# Patient Record
Sex: Female | Born: 1935 | Race: White | Hispanic: No | Smoking: Never smoker
Health system: Southern US, Community
[De-identification: ages and names within clinical notes are randomized; demographics above are authoritative.]

## PROBLEM LIST (undated history)

## (undated) DIAGNOSIS — N200 Calculus of kidney: Secondary | ICD-10-CM

## (undated) DIAGNOSIS — E039 Hypothyroidism, unspecified: Secondary | ICD-10-CM

## (undated) HISTORY — DX: Calculus of kidney: N20.0

## (undated) HISTORY — PX: HYSTERECTOMY ABDOMINAL WITH SALPINGO-OOPHORECTOMY: SHX6792

## (undated) HISTORY — DX: Hypothyroidism, unspecified: E03.9

## (undated) HISTORY — PX: APPENDECTOMY: SHX54

---

## 1970-12-26 HISTORY — PX: BREAST EXCISIONAL BIOPSY: SUR124

## 2002-11-15 ENCOUNTER — Encounter: Admission: RE | Admit: 2002-11-15 | Discharge: 2002-11-15 | Payer: Self-pay | Admitting: Internal Medicine

## 2002-11-15 ENCOUNTER — Encounter: Payer: Self-pay | Admitting: Internal Medicine

## 2004-09-23 ENCOUNTER — Ambulatory Visit (HOSPITAL_COMMUNITY): Admission: RE | Admit: 2004-09-23 | Discharge: 2004-09-23 | Payer: Self-pay | Admitting: Internal Medicine

## 2006-01-20 ENCOUNTER — Ambulatory Visit (HOSPITAL_COMMUNITY): Admission: RE | Admit: 2006-01-20 | Discharge: 2006-01-20 | Payer: Self-pay | Admitting: Internal Medicine

## 2006-04-30 ENCOUNTER — Encounter: Admission: RE | Admit: 2006-04-30 | Discharge: 2006-04-30 | Payer: Self-pay | Admitting: Orthopedic Surgery

## 2006-10-23 ENCOUNTER — Emergency Department (HOSPITAL_COMMUNITY): Admission: EM | Admit: 2006-10-23 | Discharge: 2006-10-24 | Payer: Self-pay | Admitting: Emergency Medicine

## 2007-02-07 ENCOUNTER — Ambulatory Visit (HOSPITAL_COMMUNITY): Admission: RE | Admit: 2007-02-07 | Discharge: 2007-02-07 | Payer: Self-pay | Admitting: Internal Medicine

## 2007-05-29 ENCOUNTER — Ambulatory Visit (HOSPITAL_COMMUNITY): Admission: RE | Admit: 2007-05-29 | Discharge: 2007-05-30 | Payer: Self-pay | Admitting: Obstetrics and Gynecology

## 2007-05-29 ENCOUNTER — Encounter: Payer: Self-pay | Admitting: Obstetrics and Gynecology

## 2008-02-13 ENCOUNTER — Ambulatory Visit (HOSPITAL_COMMUNITY): Admission: RE | Admit: 2008-02-13 | Discharge: 2008-02-13 | Payer: Self-pay | Admitting: Internal Medicine

## 2008-04-21 ENCOUNTER — Ambulatory Visit: Payer: Self-pay | Admitting: Internal Medicine

## 2008-04-28 ENCOUNTER — Ambulatory Visit: Payer: Self-pay | Admitting: Internal Medicine

## 2008-04-29 ENCOUNTER — Telehealth: Payer: Self-pay | Admitting: Internal Medicine

## 2008-05-04 ENCOUNTER — Emergency Department (HOSPITAL_COMMUNITY): Admission: EM | Admit: 2008-05-04 | Discharge: 2008-05-04 | Payer: Self-pay | Admitting: Emergency Medicine

## 2009-02-23 ENCOUNTER — Ambulatory Visit (HOSPITAL_COMMUNITY): Admission: RE | Admit: 2009-02-23 | Discharge: 2009-02-23 | Payer: Self-pay | Admitting: Internal Medicine

## 2010-04-20 ENCOUNTER — Ambulatory Visit (HOSPITAL_COMMUNITY): Admission: RE | Admit: 2010-04-20 | Discharge: 2010-04-20 | Payer: Self-pay | Admitting: Internal Medicine

## 2011-01-16 ENCOUNTER — Encounter: Payer: Self-pay | Admitting: Internal Medicine

## 2011-03-14 ENCOUNTER — Other Ambulatory Visit (HOSPITAL_COMMUNITY): Payer: Self-pay | Admitting: Internal Medicine

## 2011-03-14 DIAGNOSIS — Z1231 Encounter for screening mammogram for malignant neoplasm of breast: Secondary | ICD-10-CM

## 2011-04-22 ENCOUNTER — Ambulatory Visit (HOSPITAL_COMMUNITY): Payer: Self-pay

## 2011-04-27 ENCOUNTER — Ambulatory Visit (HOSPITAL_COMMUNITY)
Admission: RE | Admit: 2011-04-27 | Discharge: 2011-04-27 | Disposition: A | Discharge status: Home / Self Care | Payer: Medicare Other | Source: Ambulatory Visit | Attending: Internal Medicine | Admitting: Internal Medicine

## 2011-04-27 DIAGNOSIS — Z1231 Encounter for screening mammogram for malignant neoplasm of breast: Secondary | ICD-10-CM

## 2011-05-13 NOTE — Discharge Summary (Signed)
NAME:  Paula Liu, Paula Liu               ACCOUNT NO.:  [id number]   MEDICAL RECORD NO.:  [id number]          PATIENT TYPE:  OIB   LOCATION:  9307                          FACILITY:  [facility]   PHYSICIAN:  Cynthia P. Romine, M.D.DATE OF BIRTH:  [birth year]   DATE OF ADMISSION:  05/29/2007  DATE OF DISCHARGE:  05/30/2007                               DISCHARGE SUMMARY   DISCHARGE DIAGNOSIS:  Symptomatic cystocele and left lower quadrant pain  with left ovarian cyst.   PROCEDURE:  Laparoscopic bilateral salpingo-oophorectomy by Dr. Aram Beecham  Romine and cystoscopy with anterior repair with mesh by Dr. Eudelia Bunch.   HISTORY:  This is a 77 year old white female who was admitted with left  lower quadrant pain and a large symptomatic cystocele and a known left  ovarian cyst on ultrasound.  She was admitted on May 29, 2007 and  underwent a laparoscopic bilateral salpingo-oophorectomy by Dr. Arline Asp  Romine followed by a cystoscopy with anterior repair with mesh by Dr.  Eudelia Bunch.   Postoperatively she did very well.  She had no complications, and she  was in satisfactory condition for discharge on her first postoperative  day.  She was given full discharge instructions regarding followup in  the office with Dr. Tresa Res and with Dr. Logan Bores.   LABORATORY DATA:  Admission H&H was 14 and 42, on discharge 9 and 27.   Pathology showed a benign serous cyst on the left and a small benign  Brenner tumor.  The right tube and ovary were unremarkable, except for a  small paratubal cyst.      Cynthia P. Romine, M.D.  Electronically Signed     CPR/MEDQ  D:  07/30/2007  T:  07/30/2007  Job:  [id number]

## 2011-05-13 NOTE — Op Note (Signed)
NAME:  Paula Liu, Paula Liu               ACCOUNT NO.:  [id number]   MEDICAL RECORD NO.:  [id number]          PATIENT TYPE:  AMB   LOCATION:  SDC                           FACILITY:  [facility]   PHYSICIAN:  Cynthia P. Romine, M.D.DATE OF BIRTH:  [birth year]   DATE OF PROCEDURE:  05/29/2007  DATE OF DISCHARGE:                               OPERATIVE REPORT   PREOPERATIVE DIAGNOSIS:  Left lower quadrant pain, left ovarian cyst and  large symptomatic cystocele.   POSTOPERATIVE DIAGNOSES:  Left lower quadrant pain, left ovarian cyst  and large symptomatic cystocele, path pending.   PROCEDURE:  Laparoscopic bilateral salpingo-oophorectomy, cystocele  repair by Dr. Logan Bores which will be dictated separately.   SURGEON:  Cynthia P. Romine, M.D.   ASSISTANT:  Lum Keas, MD   ANESTHESIA:  General endotracheal.   ESTIMATED BLOOD LOSS:  Minimal.   COMPLICATIONS:  None.   PROCEDURE:  The patient was taken to the operating room and after  induction of adequate general anesthesia was placed in dorsal lithotomy  position and prepped and draped in usual fashion.  A Foley catheter was  inserted.  A subumbilical incision was made with the knife and the  Veress needle was inserted into peritoneal space.  Proper placement was  tested by noting a negative aspirate, then free flow of saline through  the Veress needle again with negative aspirate and then by noting the  response of a drop of saline placed at the hub of the Veress needle to  negative pressure as the abdominal wall was elevated.  Pneumoperitoneum  was treated with two liters of CO2 using the Storz automatic  insufflator.  A 10-mm trocar was then inserted into the peritoneal space  and its proper placement noted with a laparoscope.  Next two 5 mm  trocars were inserted under direct visualization on the right and left  side of the lower quadrants.  The pelvis was inspected.  The patient was  status post a hysterectomy.  The right tube and  ovary appeared normal.  The left ovary had a 5 cm clear looking cyst.  The remainder of the  pelvis looked normal.  Peritoneal washings were taken by putting 50 mL  of saline through the left lower wall trocar and then aspirating it.  These were sent for cytology.  The ureters were identified bilaterally.  Two Endoloops were placed around the left ovarian pedicle and the ovary  was cut free.  Similarly two 0 Vicryl Endoloops were used to go around  the right ovarian pedicle and it was cut free.  The right ovary and tube  was pulled out through the umbilical port after having inserted a 5 mm  scope to the right lower quadrant trocar sleeve.  An Endopouch was  placed through the umbilical port and the left ovary was placed into the  Endopouch.  It was then punctured with needle.  Fluid was withdrawn from  it to decrease its size and it was pulled out through the abdominal  subumbilical incision.  The umbilical trocar was then reinserted and the  abdomen reinsufflated.  The  pedicles were inspected and irrigated and  were free of  bleeding.  The trocars were all removed.  The umbilical incision was  closed subcuticularly with 4-0 Vicryl Rapid.  The right and left lower  quadrant incisions were closed with Dermabond.  The laparoscopic  procedure was terminated.  It was at this point Dr. Logan Bores came in to do  the cystocele repair which will be dictated separately.      Cynthia P. Romine, M.D.  Electronically Signed     CPR/MEDQ  D:  05/29/2007  T:  05/29/2007  Job:  [id number]

## 2011-05-13 NOTE — Op Note (Signed)
NAME:  Paula Liu, Paula Liu               ACCOUNT NO.:  [id number]   MEDICAL RECORD NO.:  [id number]          PATIENT TYPE:  AMB   LOCATION:  SDC                           FACILITY:  [facility]   PHYSICIAN:  Jamison Neighbor, M.D.  DATE OF BIRTH:  July 20, 1932   DATE OF PROCEDURE:  05/29/2007  DATE OF DISCHARGE:                               OPERATIVE REPORT   PREOPERATIVE DIAGNOSIS:  Grade 4 cystocele.   POSTOPERATIVE DIAGNOSIS:  Grade 4 cystocele.   PROCEDURES:  1. Cystoscopy.  2. Anterior pair with mesh.   SURGEON:  Jamison Neighbor, M.D.   ASSISTANT:  Lum Keas, MD.   ANESTHESIA:  General.   COMPLICATIONS:  None.   DRAINS:  A Foley catheter.   BRIEF HISTORY:  This 77 year old female is status post hysterectomy.  She is scheduled to undergo laparoscopic removal of bilateral ovaries  and has requested that her cystocele be repaired at the time of the  surgery.  The patient underwent preoperative testing.  She has no  evidence of stress urinary incontinence and seemed to have reasonably  normal bladder function.  She is to undergo anterior repair with mesh  without placement of a sling.  At the time of her initial preoperative  evaluation there did not appear to be a large rectocele, and the patient  certainly has no symptoms on the rectal side.  There also was no real  evidence of vault prolapse or enterocele.  She is to undergo anterior  pair with mesh placement.  She understands the risks and benefits of the  procedure including the possibility infection, erosion, etc.  SHE gave  full informed consent.   PROCEDURE:  The patient was brought to the operating room and underwent  a successful laparoscopic bilateral salpingo-oophorectomy.  The patient  is found in the dorsal lithotomy position, fully prepped and draped.  A  Foley catheter was in place.  The anterior vaginal mucosa was  infiltrated with local anesthetic all way back to the top of the vaginal  vault.  An incision  was made from the bladder neck all the way down to  the vaginal cuff.  Flaps were raised bilaterally extending all way back  to the endopelvic fascia, which was mobilized.  The inspection showed  that there was a massive cystocele prolapsing well out beyond the  introitus but no enterocele was detected.  The cystocele itself was then  imbricated two layers. The first was a series of mattress sutures with 2-  0 Vicryl.  This also included a good figure-of-eight suture at the  cardinal ligaments.  A second running suture of 2-0 Vicryl was used to  reinforce that area.  The anterior repair was then reinforced with mesh  and two stab incisions were made on each side in the groin crease, the  first at the level of the clitoris and the second approximately 3-3.5 cm  below that.  The appropriate position was ascertained through palpation  of the obturator fossa internally.  The needles for the Perigee support  system were then passed and all four were easily passed  without injury  to the sulcus.  With all four needles in place, cystoscopic examination  was performed and there was no evidence of any injury to the bladder.  The mesh was then placed into position.  A little bit of the redundant  tail was trimmed away.  The edges of the mesh were then sutured up to  the white line and the bottom of the mesh was sutured to the cardinal  ligaments.  The top of the mesh with support at the bladder neck there  really was no need to support the midurethra, and a midurethral sling  was not needed.  The cystoscopic examination was repeated with both 30-  degree and 70-degree lenses.  The prolapsed bladder certainly could be  seen heaped up in the midline.  The ureters could not be identified well  because of that.  There was no evidence of any injury to the bladder.  No tumors or stones could be identified.  The patient was noted be  somewhat trabeculated, most likely due to the obstructive nature of this   longstanding cystocele.  The bladder neck appeared to be well-supported  but not overcorrected and with a full bladder there was no loss of urine  but with a Crede maneuver there was a prompt straight flow of urine,  indicating that the tension appeared to be appropriate.  The four arms  of the device were then cut at the level of the skin and at the end of  the procedure those were all closed with Dermabond.  Attention was then  directed to trimming up the redundant mucosa.  The mucosa was trimmed  beginning at the bladder neck, extending all the way back.  This was  then closed with a running suture of 2-0 Vicryl.  Hemostasis appeared to  be fine.  At the end of the procedure the area was carefully packed off.  Final inspection showed good support for the vault, excellent repair of  the anterior portion, and no enterocele.  The patient was noted to have  a small rectocele.  It was not felt, however, that this should be  corrected since permission not been granted and the patient has never  had any rectal complaints.  If in the future she should develop some  rectal prolapse or worsening problems with her bowels, obviously she  could undergo a rectocele repair with or without mesh.  The the patient  tolerated the procedure and was taken to the recovery in good condition.  Due to her age and the fact that she is undergoing two procedures, she  will have 23-hour observation and will be sent home tomorrow with  antibiotic therapy and pain medications.  If the patient can urinate  without difficulty, she will be sent home without a catheter.  Otherwise, she will go home with a catheter and leg bag and have her  voiding trial in the office at a later date.           ______________________________  Jamison Neighbor, M.D.  Electronically Signed     RJE/MEDQ  D:  05/29/2007  T:  05/29/2007  Job:  [id number]   cc:   Edwena Felty. Romine, M.D.  Fax: [phone]

## 2011-10-13 LAB — COMPREHENSIVE METABOLIC PANEL
AST: 31
Alkaline Phosphatase: 58
Calcium: 9.7
GFR calc Af Amer: 58 — ABNORMAL LOW
GFR calc non Af Amer: 48 — ABNORMAL LOW

## 2011-10-13 LAB — CBC
HCT: 42.3
Hemoglobin: 9.2 — ABNORMAL LOW
MCHC: 33.8
MCHC: 34.4
Platelets: 122 — ABNORMAL LOW
Platelets: 210
RBC: 4.63
WBC: 5.7

## 2012-05-16 ENCOUNTER — Other Ambulatory Visit (HOSPITAL_COMMUNITY): Payer: Self-pay | Admitting: Internal Medicine

## 2012-05-16 DIAGNOSIS — Z1231 Encounter for screening mammogram for malignant neoplasm of breast: Secondary | ICD-10-CM

## 2012-06-05 ENCOUNTER — Ambulatory Visit (HOSPITAL_COMMUNITY)
Admission: RE | Admit: 2012-06-05 | Discharge: 2012-06-05 | Disposition: A | Discharge status: Home / Self Care | Payer: Medicare Other | Source: Ambulatory Visit | Attending: Internal Medicine | Admitting: Internal Medicine

## 2012-06-05 DIAGNOSIS — Z1231 Encounter for screening mammogram for malignant neoplasm of breast: Secondary | ICD-10-CM

## 2013-06-11 ENCOUNTER — Other Ambulatory Visit (HOSPITAL_COMMUNITY): Payer: Self-pay | Admitting: Internal Medicine

## 2013-06-11 DIAGNOSIS — Z1231 Encounter for screening mammogram for malignant neoplasm of breast: Secondary | ICD-10-CM

## 2013-06-12 ENCOUNTER — Ambulatory Visit (HOSPITAL_COMMUNITY)
Admission: RE | Admit: 2013-06-12 | Discharge: 2013-06-12 | Disposition: A | Discharge status: Home / Self Care | Payer: Medicare Other | Source: Ambulatory Visit | Attending: Internal Medicine | Admitting: Internal Medicine

## 2013-06-12 DIAGNOSIS — Z1231 Encounter for screening mammogram for malignant neoplasm of breast: Secondary | ICD-10-CM

## 2014-04-14 ENCOUNTER — Other Ambulatory Visit (HOSPITAL_COMMUNITY): Payer: Self-pay | Admitting: Internal Medicine

## 2014-04-14 DIAGNOSIS — Z1231 Encounter for screening mammogram for malignant neoplasm of breast: Secondary | ICD-10-CM

## 2014-06-13 ENCOUNTER — Ambulatory Visit (HOSPITAL_COMMUNITY): Payer: Medicare Other

## 2014-08-01 ENCOUNTER — Ambulatory Visit (HOSPITAL_COMMUNITY)
Admission: RE | Admit: 2014-08-01 | Discharge: 2014-08-01 | Disposition: A | Discharge status: Home / Self Care | Payer: Medicare Other | Source: Ambulatory Visit | Attending: Internal Medicine | Admitting: Internal Medicine

## 2014-08-01 DIAGNOSIS — Z1231 Encounter for screening mammogram for malignant neoplasm of breast: Secondary | ICD-10-CM | POA: Insufficient documentation

## 2015-07-01 ENCOUNTER — Other Ambulatory Visit (HOSPITAL_COMMUNITY): Payer: Self-pay | Admitting: Internal Medicine

## 2015-07-01 DIAGNOSIS — Z1231 Encounter for screening mammogram for malignant neoplasm of breast: Secondary | ICD-10-CM

## 2015-07-31 ENCOUNTER — Encounter: Payer: Self-pay | Admitting: Internal Medicine

## 2015-08-04 ENCOUNTER — Ambulatory Visit (HOSPITAL_COMMUNITY): Payer: Self-pay

## 2015-08-04 ENCOUNTER — Ambulatory Visit (HOSPITAL_COMMUNITY)
Admission: RE | Admit: 2015-08-04 | Discharge: 2015-08-04 | Disposition: A | Discharge status: Home / Self Care | Payer: Medicare Other | Source: Ambulatory Visit | Attending: Internal Medicine | Admitting: Internal Medicine

## 2015-08-04 DIAGNOSIS — Z1231 Encounter for screening mammogram for malignant neoplasm of breast: Secondary | ICD-10-CM | POA: Diagnosis present

## 2015-08-11 ENCOUNTER — Ambulatory Visit (HOSPITAL_COMMUNITY): Payer: Self-pay

## 2016-07-11 ENCOUNTER — Other Ambulatory Visit: Payer: Self-pay | Admitting: Internal Medicine

## 2016-07-11 DIAGNOSIS — Z1231 Encounter for screening mammogram for malignant neoplasm of breast: Secondary | ICD-10-CM

## 2016-08-04 ENCOUNTER — Ambulatory Visit: Payer: Medicare Other

## 2016-08-05 ENCOUNTER — Ambulatory Visit
Admission: RE | Admit: 2016-08-05 | Discharge: 2016-08-05 | Disposition: A | Discharge status: Home / Self Care | Payer: Medicare Other | Source: Ambulatory Visit | Attending: Internal Medicine | Admitting: Internal Medicine

## 2016-08-05 DIAGNOSIS — Z1231 Encounter for screening mammogram for malignant neoplasm of breast: Secondary | ICD-10-CM

## 2017-07-07 ENCOUNTER — Other Ambulatory Visit: Payer: Self-pay | Admitting: Internal Medicine

## 2017-07-07 DIAGNOSIS — Z1231 Encounter for screening mammogram for malignant neoplasm of breast: Secondary | ICD-10-CM

## 2017-08-07 ENCOUNTER — Ambulatory Visit
Admission: RE | Admit: 2017-08-07 | Discharge: 2017-08-07 | Disposition: A | Discharge status: Home / Self Care | Payer: Medicare Other | Source: Ambulatory Visit | Attending: Internal Medicine | Admitting: Internal Medicine

## 2017-08-07 DIAGNOSIS — Z1231 Encounter for screening mammogram for malignant neoplasm of breast: Secondary | ICD-10-CM

## 2018-01-23 ENCOUNTER — Other Ambulatory Visit: Payer: Self-pay | Admitting: Family Medicine

## 2018-01-23 DIAGNOSIS — N6310 Unspecified lump in the right breast, unspecified quadrant: Secondary | ICD-10-CM

## 2018-01-26 ENCOUNTER — Ambulatory Visit
Admission: RE | Admit: 2018-01-26 | Discharge: 2018-01-26 | Disposition: A | Discharge status: Home / Self Care | Payer: Medicare Other | Source: Ambulatory Visit | Attending: Family Medicine | Admitting: Family Medicine

## 2018-01-26 DIAGNOSIS — N6310 Unspecified lump in the right breast, unspecified quadrant: Secondary | ICD-10-CM

## 2018-05-07 ENCOUNTER — Other Ambulatory Visit: Payer: Self-pay | Admitting: Internal Medicine

## 2018-05-07 DIAGNOSIS — R42 Dizziness and giddiness: Secondary | ICD-10-CM

## 2018-05-10 ENCOUNTER — Ambulatory Visit
Admission: RE | Admit: 2018-05-10 | Discharge: 2018-05-10 | Disposition: A | Discharge status: Home / Self Care | Payer: Medicare Other | Source: Ambulatory Visit | Attending: Internal Medicine | Admitting: Internal Medicine

## 2018-05-10 DIAGNOSIS — R42 Dizziness and giddiness: Secondary | ICD-10-CM

## 2018-07-02 ENCOUNTER — Other Ambulatory Visit: Payer: Self-pay | Admitting: Internal Medicine

## 2018-07-02 DIAGNOSIS — Z1231 Encounter for screening mammogram for malignant neoplasm of breast: Secondary | ICD-10-CM

## 2018-08-09 ENCOUNTER — Ambulatory Visit
Admission: RE | Admit: 2018-08-09 | Discharge: 2018-08-09 | Disposition: A | Discharge status: Home / Self Care | Payer: Medicare Other | Source: Ambulatory Visit | Attending: Internal Medicine | Admitting: Internal Medicine

## 2018-08-09 DIAGNOSIS — Z1231 Encounter for screening mammogram for malignant neoplasm of breast: Secondary | ICD-10-CM

## 2019-07-15 ENCOUNTER — Other Ambulatory Visit: Payer: Self-pay | Admitting: Internal Medicine

## 2019-07-15 DIAGNOSIS — Z1231 Encounter for screening mammogram for malignant neoplasm of breast: Secondary | ICD-10-CM

## 2019-08-28 ENCOUNTER — Other Ambulatory Visit: Payer: Self-pay

## 2019-08-28 ENCOUNTER — Ambulatory Visit
Admission: RE | Admit: 2019-08-28 | Discharge: 2019-08-28 | Disposition: A | Discharge status: Home / Self Care | Payer: Medicare Other | Source: Ambulatory Visit | Attending: Internal Medicine | Admitting: Internal Medicine

## 2019-08-28 DIAGNOSIS — Z1231 Encounter for screening mammogram for malignant neoplasm of breast: Secondary | ICD-10-CM

## 2020-07-17 ENCOUNTER — Other Ambulatory Visit: Payer: Self-pay | Admitting: Internal Medicine

## 2020-07-17 DIAGNOSIS — Z1231 Encounter for screening mammogram for malignant neoplasm of breast: Secondary | ICD-10-CM

## 2020-08-28 ENCOUNTER — Other Ambulatory Visit: Payer: Self-pay

## 2020-08-28 ENCOUNTER — Ambulatory Visit
Admission: RE | Admit: 2020-08-28 | Discharge: 2020-08-28 | Disposition: A | Discharge status: Home / Self Care | Payer: Medicare Other | Source: Ambulatory Visit | Attending: Internal Medicine | Admitting: Internal Medicine

## 2020-08-28 DIAGNOSIS — Z1231 Encounter for screening mammogram for malignant neoplasm of breast: Secondary | ICD-10-CM

## 2021-07-30 ENCOUNTER — Other Ambulatory Visit: Payer: Self-pay | Admitting: Internal Medicine

## 2021-07-30 DIAGNOSIS — Z1231 Encounter for screening mammogram for malignant neoplasm of breast: Secondary | ICD-10-CM

## 2021-09-21 ENCOUNTER — Ambulatory Visit
Admission: RE | Admit: 2021-09-21 | Discharge: 2021-09-21 | Disposition: A | Discharge status: Home / Self Care | Payer: Medicare Other | Source: Ambulatory Visit | Attending: Internal Medicine | Admitting: Internal Medicine

## 2021-09-21 ENCOUNTER — Other Ambulatory Visit: Payer: Self-pay

## 2021-09-21 DIAGNOSIS — Z1231 Encounter for screening mammogram for malignant neoplasm of breast: Secondary | ICD-10-CM

## 2022-08-05 NOTE — Progress Notes (Signed)
Paula Garrison, MD Reason for referral-palpitations  HPI: 88 year old female for evaluation of palpitations at request of Creola Corn, MD. patient typically does not have dyspnea on exertion, orthopnea, PND, pedal edema, chest pain and there is no history of syncope.  She had a bout of palpitations 2 years ago.  She had 2 more episodes recently.  They are described as sudden onset of her heart racing in her throat.  There is no associated dizziness, syncope, chest pain or shortness of breath.  Typically lasts several minutes and resolve spontaneously.  Not associated with activities.  Cardiology now asked to evaluate.  Current Outpatient Medications  Medication Sig Dispense Refill   levothyroxine (SYNTHROID) 75 MCG tablet Take 75 mcg by mouth daily.     Multiple Vitamins-Minerals (VITAMIN D3 COMPLETE PO) Take by mouth.     Zinc 50 MG TABS Take 50 mg by mouth daily.     No current facility-administered medications for this visit.    Allergies  Allergen Reactions   Ciprofloxacin Other (See Comments)    dizziness     Past Medical History:  Diagnosis Date   Hypothyroid    Nephrolithiasis     Past Surgical History:  Procedure Laterality Date   APPENDECTOMY     BREAST EXCISIONAL BIOPSY Left 1972   benign   HYSTERECTOMY ABDOMINAL WITH SALPINGO-OOPHORECTOMY      Social History   Socioeconomic History   Marital status: Divorced    Spouse name: Not on file   Number of children: 4   Years of education: Not on file   Highest education level: Not on file  Occupational History   Not on file  Tobacco Use   Smoking status: Never   Smokeless tobacco: Never  Substance and Sexual Activity   Alcohol use: Never   Drug use: Not on file   Sexual activity: Not on file  Other Topics Concern   Not on file  Social History Narrative   Not on file   Social Determinants of Health   Financial Resource Strain: Not on file  Food Insecurity: Not on file  Transportation Needs:  Not on file  Physical Activity: Not on file  Stress: Not on file  Social Connections: Not on file  Intimate Partner Violence: Not on file    Family History  Problem Relation Age of Onset   Cancer Mother    Breast cancer Neg Hx     ROS: no fevers or chills, productive cough, hemoptysis, dysphasia, odynophagia, melena, hematochezia, dysuria, hematuria, rash, seizure activity, orthopnea, PND, pedal edema, claudication. Remaining systems are negative.  Physical Exam:   Blood pressure 138/82, pulse 66, height 5\' 3"  (1.6 m), weight 123 lb 6.4 oz (56 kg), SpO2 98 %.  General:  Well developed/well nourished in NAD Skin warm/dry Patient not depressed No peripheral clubbing Back-normal HEENT-normal/normal eyelids Neck supple/normal carotid upstroke bilaterally; no bruits; no JVD; no thyromegaly chest - CTA/ normal expansion CV - RRR/normal S1 and S2; no murmurs, rubs or gallops;  PMI nondisplaced Abdomen -NT/ND, no HSM, no mass, + bowel sounds, no bruit 2+ femoral pulses, no bruits Ext-no edema, chords, 2+ DP Neuro-grossly nonfocal  ECG -normal sinus rhythm at a rate of 66, cannot rule out septal infarct.  Personally reviewed  A/P  1 palpitations-etiology unclear.  We will arrange an echocardiogram to assess LV function.  Schedule 2 week Zio patch to further assess.  2 hypothyroidism-continue Synthroid.  We will have most recent TSH forwarded to from primary  care.  Olga Millers, MD

## 2022-08-18 ENCOUNTER — Ambulatory Visit: Payer: Medicare Other | Admitting: Cardiology

## 2022-08-18 ENCOUNTER — Ambulatory Visit (INDEPENDENT_AMBULATORY_CARE_PROVIDER_SITE_OTHER): Payer: Medicare Other

## 2022-08-18 ENCOUNTER — Encounter: Payer: Self-pay | Admitting: Cardiology

## 2022-08-18 VITALS — BP 138/82 | HR 66 | Ht 63.0 in | Wt 123.4 lb

## 2022-08-18 DIAGNOSIS — R002 Palpitations: Secondary | ICD-10-CM | POA: Diagnosis not present

## 2022-08-18 NOTE — Progress Notes (Unsigned)
Enrolled for [employer] to mail a ZIO XT long term holter monitor to the patients address on file.  ZIO XT serial # [id number] mailed to patient on 08/18/22 was applied in office on 08/26/22 using tincture on benzoin to reduce skin irritation.

## 2022-08-18 NOTE — Patient Instructions (Signed)
Medication Instructions:  No changes *If you need a refill on your cardiac medications before your next appointment, please call your pharmacy*   Lab Work: None ordered If you have labs (blood work) drawn today and your tests are completely normal, you will receive your results only by: MyChart Message (if you have MyChart) OR A paper copy in the mail If you have any lab test that is abnormal or we need to change your treatment, we will call you to review the results.   Testing/Procedures: Your physician has requested that you have an echocardiogram. Echocardiography is a painless test that uses sound waves to create images of your heart. It provides your doctor with information about the size and shape of your heart and how well your heart's chambers and valves are working. You may receive an ultrasound enhancing agent through an IV if needed to better visualize your heart during the echo.This procedure takes approximately one hour. There are no restrictions for this procedure. This will take place at the [address], [address].     Follow-Up: At [facility], you and your health needs are our priority.  As part of our continuing mission to provide you with exceptional heart care, we have created designated Provider Care Teams.  These Care Teams include your primary Cardiologist (physician) and Advanced Practice Providers (APPs -  Physician Assistants and Nurse Practitioners) who all work together to provide you with the care you need, when you need it.  We recommend signing up for the patient portal called "MyChart".  Sign up information is provided on this After Visit Summary.  MyChart is used to connect with patients for Virtual Visits (Telemedicine).  Patients are able to view lab/test results, encounter notes, upcoming appointments, etc.  Non-urgent messages can be sent to your provider as well.   To learn more about what you can do with MyChart, go to [email or web address].     Your next appointment:   4 month(s)  The format for your next appointment:   In Person  Provider:   Olga Millers, MD {  Other Instructions ZIO XT- Long Term Monitor Instructions  Your physician has requested you wear a ZIO patch monitor for 14 days.  This is a single patch monitor. [employer] supplies one patch monitor per enrollment. Additional stickers are not available. Please do not apply patch if you will be having a Nuclear Stress Test,  Echocardiogram, Cardiac CT, MRI, or Chest Xray during the period you would be wearing the  monitor. The patch cannot be worn during these tests. You cannot remove and re-apply the  ZIO XT patch monitor.  Your ZIO patch monitor will be mailed 3 day USPS to your address on file. It may take 3-5 days  to receive your monitor after you have been enrolled.  Once you have received your monitor, please review the enclosed instructions. Your monitor  has already been registered assigning a specific monitor serial # to you.  Billing and Patient Assistance Program Information  We have supplied [employer] with any of your insurance information on file for billing purposes. [employer] offers a sliding scale Patient Assistance Program for patients that do not have  insurance, or whose insurance does not completely cover the cost of the ZIO monitor.  You must apply for the Patient Assistance Program to qualify for this discounted rate.  To apply, please call [employer] at [phone], select option 4, select option 2, ask to apply for  Patient Assistance Program. Meredeth Ide will  ask your household income, and how many people  are in your household. They will quote your out-of-pocket cost based on that information.  [employer] will also be able to set up a 4-month, interest-free payment plan if needed.  Applying the monitor   Shave hair from upper left chest.  Hold abrader disc by orange tab. Rub abrader in 40 strokes over the upper left chest as  indicated in  your monitor instructions.  Clean area with 4 enclosed alcohol pads. Let dry.  Apply patch as indicated in monitor instructions. Patch will be placed under collarbone on left  side of chest with arrow pointing upward.  Rub patch adhesive wings for 2 minutes. Remove white label marked "1". Remove the white  label marked "2". Rub patch adhesive wings for 2 additional minutes.  While looking in a mirror, press and release button in center of patch. A small green light will  flash 3-4 times. This will be your only indicator that the monitor has been turned on.  Do not shower for the first 24 hours. You may shower after the first 24 hours.  Press the button if you feel a symptom. You will hear a small click. Record Date, Time and  Symptom in the Patient Logbook.  When you are ready to remove the patch, follow instructions on the last 2 pages of Patient  Logbook. Stick patch monitor onto the last page of Patient Logbook.  Place Patient Logbook in the blue and white box. Use locking tab on box and tape box closed  securely. The blue and white box has prepaid postage on it. Please place it in the mailbox as  soon as possible. Your physician should have your test results approximately 7 days after the  monitor has been mailed back to [facility].  Call [facility] Customer Care at [phone] if you have questions regarding  your ZIO XT patch monitor. Call them immediately if you see an orange light blinking on your  monitor.  If your monitor falls off in less than 4 days, contact our Monitor department at [phone].  If your monitor becomes loose or falls off after 4 days call [employer] at [phone] for  suggestions on securing your monitor   Important Information About Sugar

## 2022-08-24 ENCOUNTER — Telehealth: Payer: Self-pay | Admitting: Cardiology

## 2022-08-24 NOTE — Telephone Encounter (Signed)
Patient states she received her long term monitor and she is requesting to have it placed on 9/01 when she comes if for her echo if at all possible. Please advise.

## 2022-08-24 NOTE — Telephone Encounter (Signed)
Spoke to patient she stated she received monitor last Sat.She is scheduled for a echo 9/1.She wants someone to put on after her echo.I will message monitor tech to see if she can put on after echo.

## 2022-08-24 NOTE — Telephone Encounter (Signed)
Spoke to patient monitor will be put on after echo 9/1 at 4:15 pm.

## 2022-08-26 ENCOUNTER — Ambulatory Visit (HOSPITAL_COMMUNITY): Payer: Medicare Other | Attending: Cardiology

## 2022-08-26 ENCOUNTER — Ambulatory Visit: Payer: Medicare Other

## 2022-08-26 DIAGNOSIS — R0602 Shortness of breath: Secondary | ICD-10-CM | POA: Insufficient documentation

## 2022-08-26 DIAGNOSIS — I361 Nonrheumatic tricuspid (valve) insufficiency: Secondary | ICD-10-CM | POA: Diagnosis not present

## 2022-08-26 DIAGNOSIS — R002 Palpitations: Secondary | ICD-10-CM | POA: Insufficient documentation

## 2022-08-26 LAB — ECHOCARDIOGRAM COMPLETE
Area-P 1/2: 2.83 cm2
S' Lateral: 2.5 cm

## 2022-08-30 ENCOUNTER — Other Ambulatory Visit: Payer: Self-pay | Admitting: Internal Medicine

## 2022-08-30 ENCOUNTER — Encounter: Payer: Self-pay | Admitting: *Deleted

## 2022-08-30 DIAGNOSIS — Z1231 Encounter for screening mammogram for malignant neoplasm of breast: Secondary | ICD-10-CM

## 2022-09-15 ENCOUNTER — Telehealth: Payer: Self-pay | Admitting: *Deleted

## 2022-09-15 MED ORDER — METOPROLOL SUCCINATE ER 25 MG PO TB24: 12.5000 mg | ORAL_TABLET | Freq: Every day | ORAL | 3 refills | Status: DC

## 2022-09-15 NOTE — Telephone Encounter (Signed)
Spoke with Paula Liu, aware of monitor results Aware of dr Jacalyn Lefevre recommendations.  New script sent to the pharmacy

## 2022-09-15 NOTE — Telephone Encounter (Signed)
-----   Message from Lelon Perla, MD sent at 09/15/2022  3:53 PM EDT ----- Add toprol 12.5 mg daily Kirk Ruths

## 2022-09-23 ENCOUNTER — Ambulatory Visit
Admission: RE | Admit: 2022-09-23 | Discharge: 2022-09-23 | Disposition: A | Discharge status: Home / Self Care | Payer: Medicare Other | Source: Ambulatory Visit | Attending: Internal Medicine | Admitting: Internal Medicine

## 2022-09-23 DIAGNOSIS — Z1231 Encounter for screening mammogram for malignant neoplasm of breast: Secondary | ICD-10-CM

## 2022-12-08 ENCOUNTER — Encounter: Payer: Self-pay | Admitting: Cardiology

## 2022-12-08 ENCOUNTER — Ambulatory Visit: Payer: Medicare Other | Attending: Cardiology | Admitting: Cardiology

## 2022-12-08 VITALS — BP 120/60 | HR 79 | Ht 63.0 in | Wt 126.8 lb

## 2022-12-08 DIAGNOSIS — R002 Palpitations: Secondary | ICD-10-CM

## 2022-12-08 NOTE — Patient Instructions (Signed)
  Follow-Up: At [facility], you and your health needs are our priority.  As part of our continuing mission to provide you with exceptional heart care, we have created designated Provider Care Teams.  These Care Teams include your primary Cardiologist (physician) and Advanced Practice Providers (APPs -  Physician Assistants and Nurse Practitioners) who all work together to provide you with the care you need, when you need it.  We recommend signing up for the patient portal called "MyChart".  Sign up information is provided on this After Visit Summary.  MyChart is used to connect with patients for Virtual Visits (Telemedicine).  Patients are able to view lab/test results, encounter notes, upcoming appointments, etc.  Non-urgent messages can be sent to your provider as well.   To learn more about what you can do with MyChart, go to [email or web address].    Your next appointment:   12 month(s)  The format for your next appointment:   In Person  Provider:   Brian Crenshaw, MD   

## 2022-12-08 NOTE — Progress Notes (Signed)
     HPI: FU palpitations; echocardiogram September 2023 showed normal LV function, mild right ventricular enlargement, mild right atrial enlargement; trace aortic insufficiency.  Monitor September 2023 showed sinus rhythm with occasional PACs, brief PAT, PVCs, rare couplet and triplet.  Metoprolol added.  Since last seen she denies dyspnea, chest pain or syncope.  Occasional palpitations but much improved.  Current Outpatient Medications  Medication Sig Dispense Refill   levothyroxine (SYNTHROID) 75 MCG tablet Take 75 mcg by mouth daily.     metoprolol succinate (TOPROL XL) 25 MG 24 hr tablet Take 0.5 tablets (12.5 mg total) by mouth at bedtime. 45 tablet 3   Multiple Vitamins-Minerals (VITAMIN D3 COMPLETE PO) Take by mouth.     Zinc 50 MG TABS Take 50 mg by mouth daily.     No current facility-administered medications for this visit.     Past Medical History:  Diagnosis Date   Hypothyroid    Nephrolithiasis     Past Surgical History:  Procedure Laterality Date   APPENDECTOMY     BREAST EXCISIONAL BIOPSY Left 1972   benign   HYSTERECTOMY ABDOMINAL WITH SALPINGO-OOPHORECTOMY      Social History   Socioeconomic History   Marital status: Divorced    Spouse name: Not on file   Number of children: 4   Years of education: Not on file   Highest education level: Not on file  Occupational History   Not on file  Tobacco Use   Smoking status: Never   Smokeless tobacco: Never  Substance and Sexual Activity   Alcohol use: Never   Drug use: Not on file   Sexual activity: Not on file  Other Topics Concern   Not on file  Social History Narrative   Not on file   Social Determinants of Health   Financial Resource Strain: Not on file  Food Insecurity: Not on file  Transportation Needs: Not on file  Physical Activity: Not on file  Stress: Not on file  Social Connections: Not on file  Intimate Partner Violence: Not on file    Family History  Problem Relation Age of Onset    Cancer Mother    Breast cancer Neg Hx     ROS: no fevers or chills, productive cough, hemoptysis, dysphasia, odynophagia, melena, hematochezia, dysuria, hematuria, rash, seizure activity, orthopnea, PND, pedal edema, claudication. Remaining systems are negative.  Physical Exam: Well-developed well-nourished in no acute distress.  Skin is warm and dry.  HEENT is normal.  Neck is supple.  Chest is clear to auscultation with normal expansion.  Cardiovascular exam is regular rate and rhythm.  Abdominal exam nontender or distended. No masses palpated. Extremities show no edema. neuro grossly intact   A/P  1 palpitations-symptoms are controlled.  Continue low-dose metoprolol.  Can advance if necessary.  2 hypothyroidism-continue Synthroid.  Monitored by primary care.  Olga Millers, MD

## 2022-12-23 ENCOUNTER — Ambulatory Visit: Payer: Medicare Other | Admitting: Adult Health

## 2023-03-28 ENCOUNTER — Other Ambulatory Visit: Payer: Self-pay | Admitting: Cardiology

## 2023-03-28 NOTE — Telephone Encounter (Signed)
Left message to call back  

## 2023-03-28 NOTE — Telephone Encounter (Signed)
Left message to call back to discuss Metoprolol dosage. I do not see where this was increased as stated in message.

## 2023-03-28 NOTE — Telephone Encounter (Signed)
Pt states that she was told to go from .5 a dose to a whole pill. She started taking a whole pill on 2/28. She states her pharmacy has told her Dr. Stanford Breed needs to call in a new prescription.    *STAT* If patient is at the pharmacy, call can be transferred to refill team.   1. Which medications need to be refilled? (please list name of each medication and dose if known) metoprolol succinate (TOPROL XL) 25 MG 24 hr tablet   2. Which pharmacy/location (including street and city if local pharmacy) is medication to be sent to?[id number] - [place], [place] - [address].   3. Do they need a 30 day or 90 day supply? 30 day - pt states whatever is normally done for this medication

## 2023-03-29 MED ORDER — METOPROLOL SUCCINATE ER 25 MG PO TB24: 25.0000 mg | ORAL_TABLET | Freq: Every day | ORAL | 3 refills | Status: DC

## 2023-03-29 NOTE — Telephone Encounter (Signed)
Called patient, she states at last OV with Dr.Crenshaw she was advised she could increase to the 25 mg tablet if needed.   See note from MD:  1 palpitations-symptoms are controlled.  Continue low-dose metoprolol.  Can advance if necessary.    Will route to MD as Juluis Rainier that RX is updated to 25 mg daily.   Thanks!

## 2023-03-29 NOTE — Telephone Encounter (Signed)
Pt returning nurses call from yesterday. Please advise 

## 2023-05-24 IMAGING — MG MM DIGITAL SCREENING BILAT W/ TOMO AND CAD
8 series · 9 of 24 positions shown · non-contrast
Comparison: Previous exam(s).

CLINICAL DATA: Screening.

EXAM:
DIGITAL SCREENING BILATERAL MAMMOGRAM WITH TOMOSYNTHESIS AND CAD
TECHNIQUE: Bilateral screening digital craniocaudal and mediolateral oblique
mammograms were obtained. Bilateral screening digital breast
tomosynthesis was performed. The images were evaluated with
computer-aided detection.

[R MLO synth-2D]
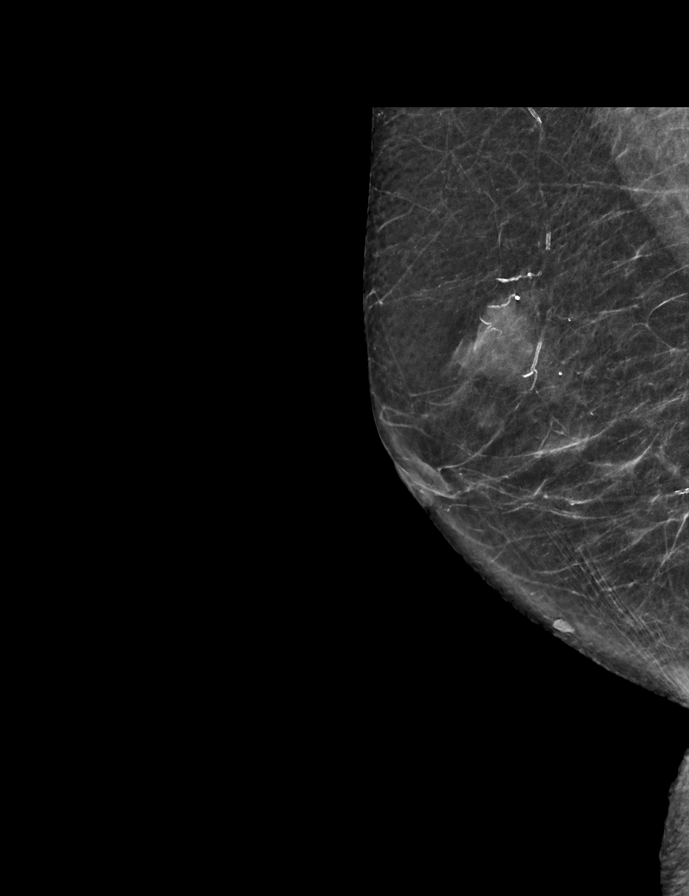

[L MLO synth-2D]
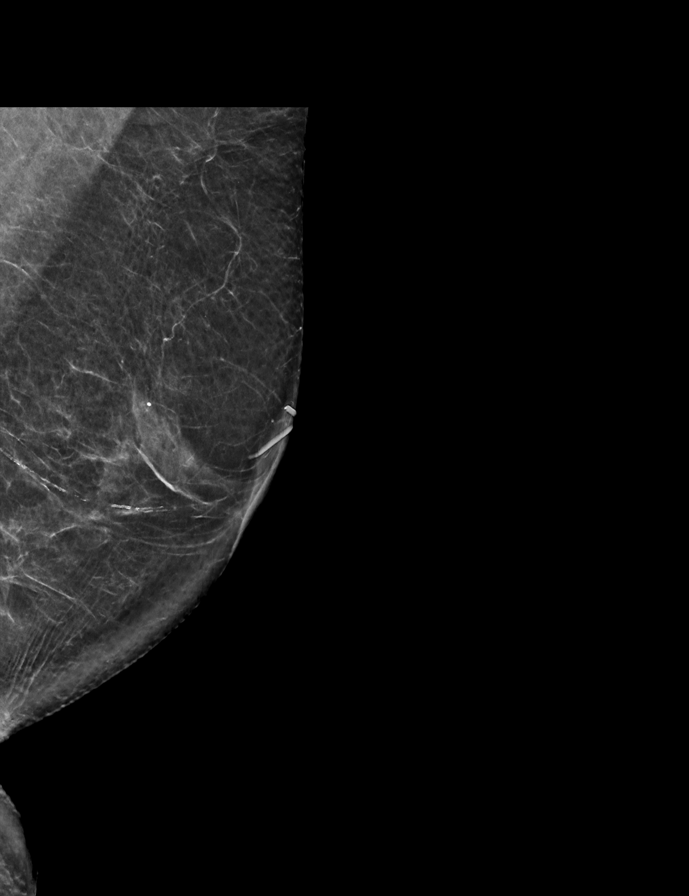

[L CC synth-2D]
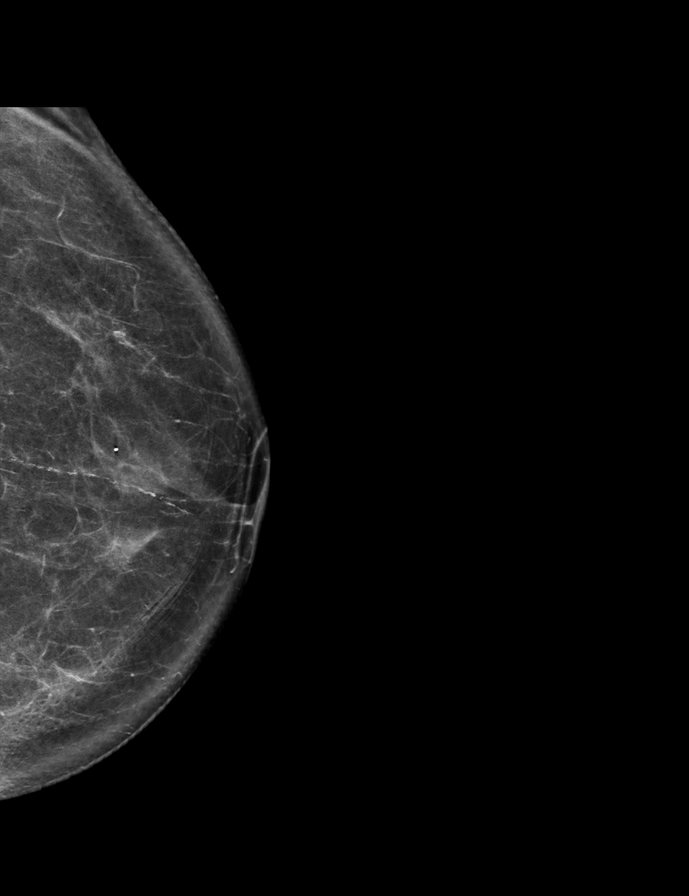

[R CC synth-2D]
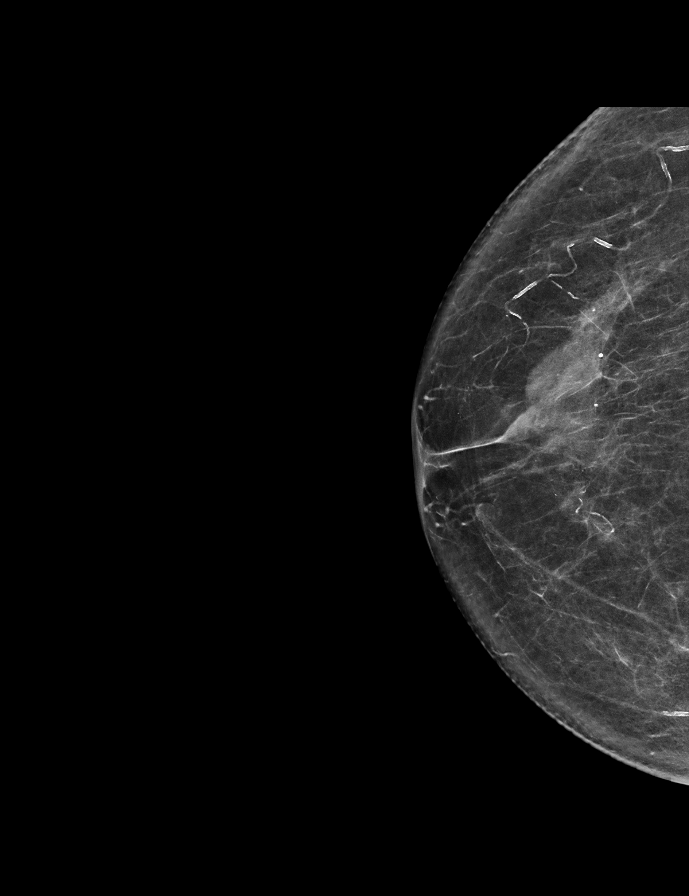

[R CC tomo · 2 of 68 frames shown]
[frame 22/68]
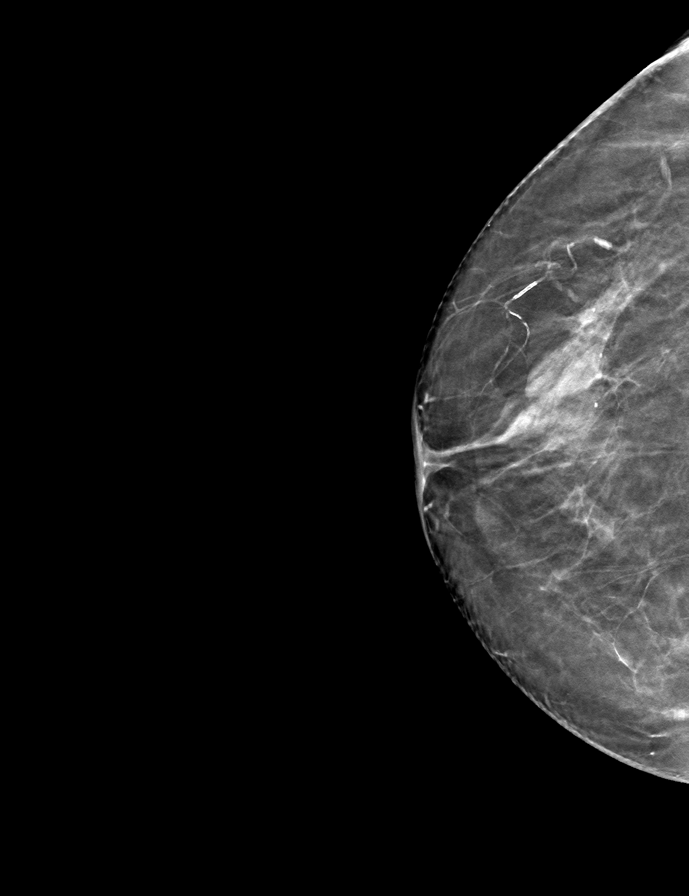
[frame 35/68]
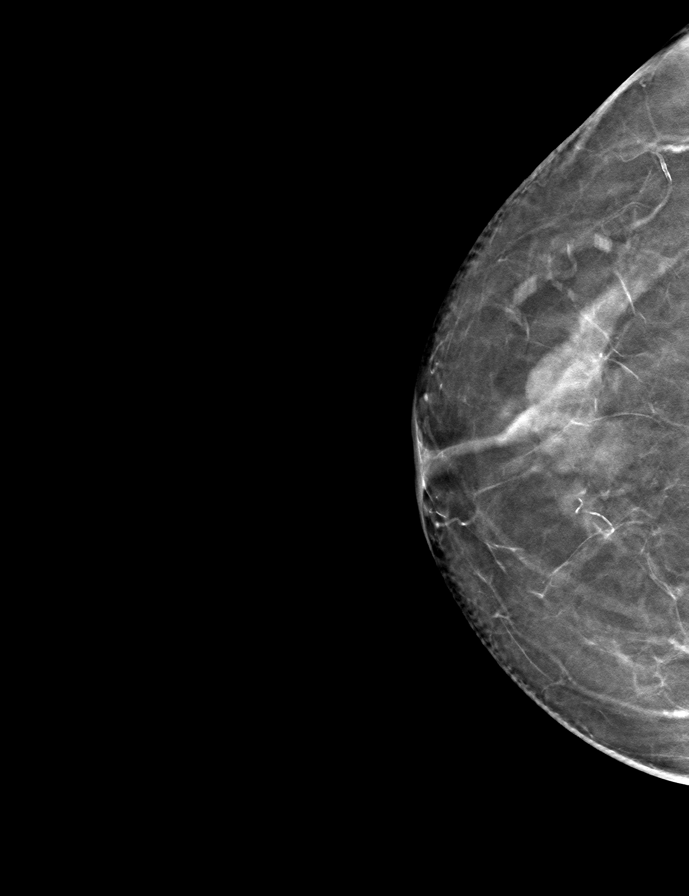

[L MLO tomo · tomo slice 36/71.0]
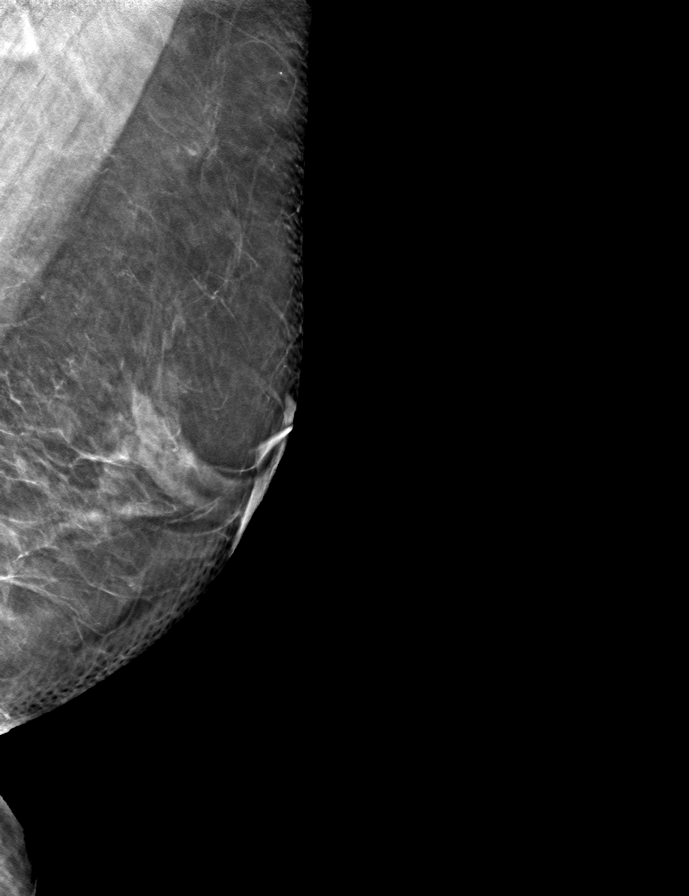

[L CC tomo · tomo slice 39/76.0]
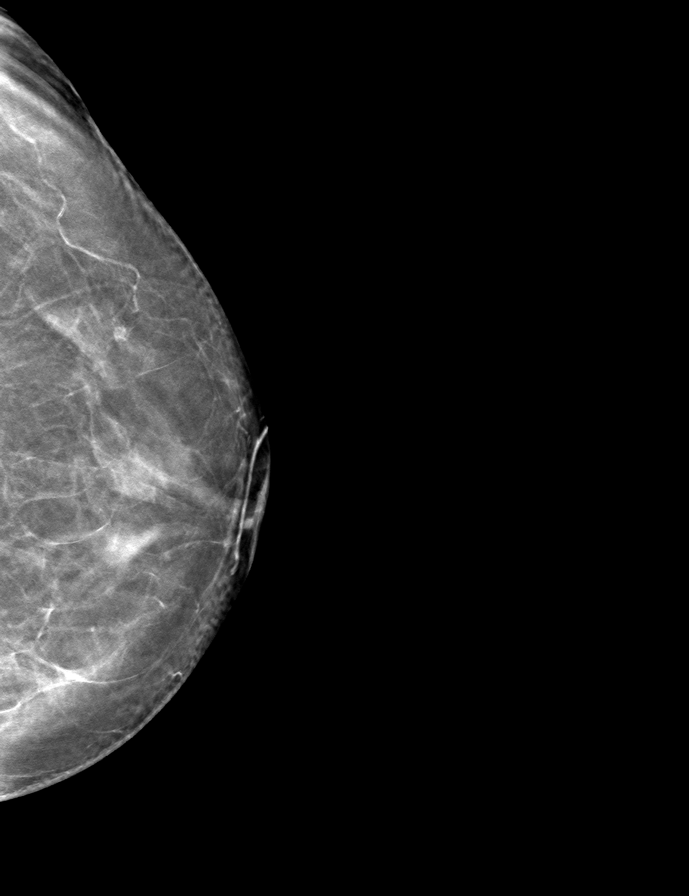

[R MLO tomo · tomo slice 33/66.0]
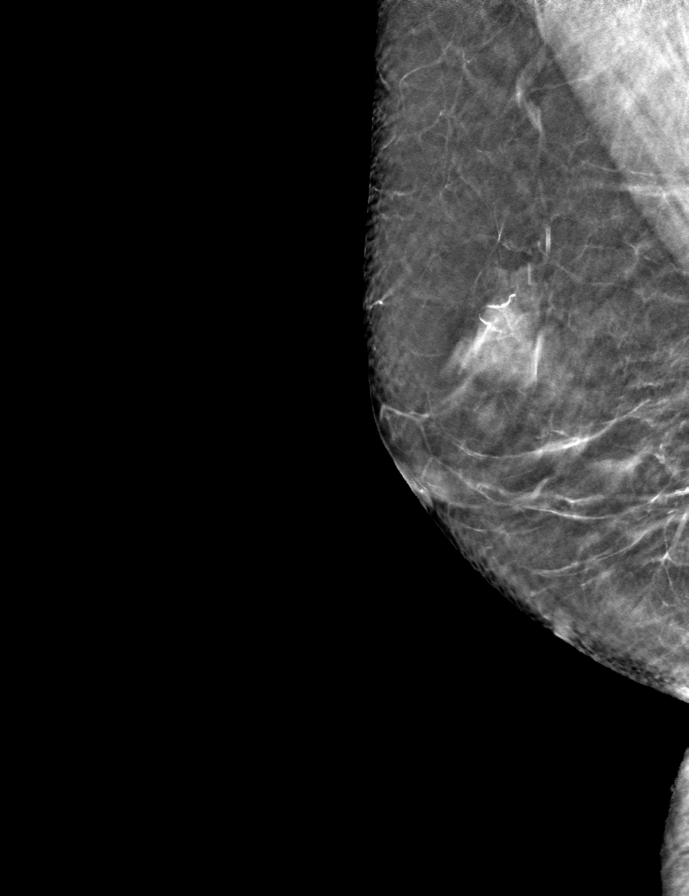

[9 of 24 positions shown; findings below may reference images not displayed]

ACR Breast Density Category b: There are scattered areas of
fibroglandular density.
FINDINGS: There are no findings suspicious for malignancy.
IMPRESSION: No mammographic evidence of malignancy. A result letter of this
screening mammogram will be mailed directly to the patient.

RECOMMENDATION:
Screening mammogram in one year. (Code:51-O-LD2)

BI-RADS CATEGORY  1: Negative.

## 2023-07-25 ENCOUNTER — Telehealth: Payer: Self-pay

## 2023-07-25 NOTE — Telephone Encounter (Signed)
Patient cousin sent message regarding patient, not DPR so unable to discuss.  Advised would call patient Patient states Saturday morning her Pulse was elevated to 89, then 109, 74, 70. Patient asymptomatic.  She states she "just knew pulse was faster".  She states it was only elevated for a little while.  She checked her HR  for an hour.   She states had stomach issues the day before and may not have been as hydrated as needed.  States no issues prior or since this episode Saturday.  Please advise if sooner appt.  She would like to see Der Jens Som in [place] if possible, if unavailable APP will be fine

## 2023-07-26 NOTE — Telephone Encounter (Signed)
Spoke with pt, she reports now her blood pressure is running high.162/75/60 to 171/76/61. She has taken her bp 4 times since 8 am. Explained to the patient, her anxiety is causing her blood pressure to continue to go high. She reports she does not feel good but can not tell me a symptom she is having. Appointment made for her to see the NP here in [place] because of no opening in [place]. Patient was told to check her bp once in the morning and once in the afternoon only and write it down to bring to the appointment. She was also encouraged to bring her bp machine so it can be correlated. She was also encouraged to call her medical doctor if she feels she needs to be seen sooner. Directions to the office discussed.

## 2023-08-02 NOTE — Progress Notes (Unsigned)
Cardiology Office Note:  .   Date:  08/03/2023  ID:  Paula Liu, DOB March 31, 1932, MRN [id number] PCP: Creola Corn, MD  [facility] Providers Cardiologist:  Olga Millers, MD    History of Present Illness: .   Paula Liu is a 89 y.o. female with past medical history of palpitations, hypothyroidism, CKD stage IIIa, Raynauds and osteoarthritis.   First evaluated by Dr. Jens Som on 08/18/2022 for evaluation of palpitations.  She described sudden onset of her heart racing into her throat, lasting several minutes and resolving spontaneously.  Echocardiogram on 08/26/2022 indicated EF 60 to 65% LV with normal function and no RWMA diastolic parameters were normal.  She had trivial aortic valve regurgitation.  She wore a cardiac monitor for 13 days that showed a minimum heart rate of 41 bpm, max 203 bpm and average of 66 bpm.  Predominant underlying rhythm was sinus.  She had 514 SVT runs, run with the fastest interval lasting 5 beats with a max rate of 203 bpm, longest lasting 16.6 seconds with an average rate of 107 bpm.  She was started on Toprol 12.5 mg daily.  She was last seen by Dr. Jens Som on 12/08/2022.  She reported that her palpitations were much better controlled.    On 07/25/2023 she notified the office of elevated pulse, noted that she had GI problems the day before and may not have been as well-hydrated as needed. The following day she reported elevated blood pressures, 160-170/70's.  Reported that she overall felt unwell but was unable to provide specifics.  Today she reports that she is doing well overall.  She has been monitoring her blood pressure twice daily, averaging 150/70, ranging from 115/60 to 166/75.  She denies symptoms with elevated blood pressures.  She reports that her palpitations have remained improved, notes them on occasion but are not bothersome.  She denies chest pain, shortness of breath, lower extremity edema, presyncope, PND or orthopnea.  ROS: Today she  denies chest pain, shortness of breath, lower extremity edema, fatigue, melena, hematuria, hemoptysis, diaphoresis, weakness, presyncope, syncope, orthopnea, and PND.   Studies Reviewed: Marland Kitchen   EKG Interpretation Date/Time:  Thursday August 03 2023 14:34:29 EDT Ventricular Rate:  68 PR Interval:  124 QRS Duration:  80 QT Interval:  388 QTC Calculation: 412 R Axis:   36  Text Interpretation: Sinus rhythm with Premature atrial complexes No acute changes Confirmed by Reather Littler [phone]) on 08/03/2023 3:31:56 PM   Cardiac Studies & Procedures       ECHOCARDIOGRAM  ECHOCARDIOGRAM COMPLETE 08/26/2022  Narrative ECHOCARDIOGRAM REPORT    Patient Name:   Paula Liu Date of Exam: 08/26/2022 Medical Rec #:  [id number]       Height:       63.0 in Accession #:    [id number]      Weight:       123.4 lb Date of Birth:  10/25/1932       BSA:          1.575 m Patient Age:    89 years        BP:           138/82 mmHg Patient Gender: F               HR:           74 bpm. Exam Location:  [address]  Procedure: 2D Echo, Cardiac Doppler and Color Doppler  Indications:    R00.2 Palpitations  History:  Patient has no prior history of Echocardiogram examinations. Signs/Symptoms:Shortness of Breath. Palpitations.  Sonographer:    Farrel Conners RDCS Referring Phys: Madolyn Frieze CRENSHAW  IMPRESSIONS   1. Left ventricular ejection fraction, by estimation, is 60 to 65%. The left ventricle has normal function. The left ventricle has no regional wall motion abnormalities. Left ventricular diastolic parameters were normal. 2. Right ventricular systolic function is normal. The right ventricular size is mildly enlarged. There is normal pulmonary artery systolic pressure. 3. Right atrial size was mildly dilated. 4. The mitral valve is normal in structure. Trivial mitral valve regurgitation. 5. The aortic valve is tricuspid. Aortic valve regurgitation is trivial.  FINDINGS Left Ventricle: Left  ventricular ejection fraction, by estimation, is 60 to 65%. The left ventricle has normal function. The left ventricle has no regional wall motion abnormalities. The left ventricular internal cavity size was normal in size. There is no left ventricular hypertrophy. Left ventricular diastolic parameters were normal.  Right Ventricle: The right ventricular size is mildly enlarged. Right vetricular wall thickness was not assessed. Right ventricular systolic function is normal. There is normal pulmonary artery systolic pressure. The tricuspid regurgitant velocity is 2.69 m/s, and with an assumed right atrial pressure of 3 mmHg, the estimated right ventricular systolic pressure is 31.9 mmHg.  Left Atrium: Left atrial size was normal in size.  Right Atrium: Right atrial size was mildly dilated.  Pericardium: There is no evidence of pericardial effusion.  Mitral Valve: The mitral valve is normal in structure. Trivial mitral valve regurgitation.  Tricuspid Valve: The tricuspid valve is normal in structure. Tricuspid valve regurgitation is mild.  Aortic Valve: The aortic valve is tricuspid. Aortic valve regurgitation is trivial.  Pulmonic Valve: The pulmonic valve was normal in structure. Pulmonic valve regurgitation is trivial.  Aorta: The aortic root and ascending aorta are structurally normal, with no evidence of dilitation.  IAS/Shunts: No atrial level shunt detected by color flow Doppler.   LEFT VENTRICLE PLAX 2D LVIDd:         4.20 cm   Diastology LVIDs:         2.50 cm   LV e' medial:    8.59 cm/s LV PW:         0.70 cm   LV E/e' medial:  7.8 LV IVS:        0.80 cm   LV e' lateral:   10.60 cm/s LVOT diam:     2.10 cm   LV E/e' lateral: 6.3 LV SV:         74 LV SV Index:   47 LVOT Area:     3.46 cm  3D Volume EF: 3D EF:        66 % LV EDV:       111 ml LV ESV:       38 ml LV SV:        73 ml  RIGHT VENTRICLE RV Basal diam:  4.20 cm RV Mid diam:    4.00 cm RV S prime:      14.40 cm/s TAPSE (M-mode): 2.5 cm  LEFT ATRIUM           Index        RIGHT ATRIUM           Index LA diam:      3.20 cm 2.03 cm/m   RA Area:     17.70 cm LA Vol (A2C): 47.5 ml 30.16 ml/m  RA Volume:   53.40 ml  33.91 ml/m  LA Vol (A4C): 33.9 ml 21.53 ml/m AORTIC VALVE LVOT Vmax:   110.00 cm/s LVOT Vmean:  61.600 cm/s LVOT VTI:    0.214 m  AORTA Ao Root diam: 3.50 cm Ao Asc diam:  3.50 cm  MITRAL VALVE               TRICUSPID VALVE MV Area (PHT): cm         TR Peak grad:   28.9 mmHg MV Decel Time: 268 msec    TR Vmax:        269.00 cm/s MV E velocity: 67.10 cm/s MV A velocity: 67.90 cm/s  SHUNTS MV E/A ratio:  0.99        Systemic VTI:  0.21 m Systemic Diam: 2.10 cm  Dietrich Pates MD Electronically signed by Dietrich Pates MD Signature Date/Time: 08/26/2022/10:42:23 PM    Final    MONITORS  LONG TERM MONITOR (3-14 DAYS) 09/15/2022  Narrative Patch Wear Time:  13 days and 20 hours (2023-09-01T15:50:44-0400 to 2023-09-15T12:19:16-0400)  Patient had a min HR of 41 bpm, max HR of 203 bpm, and avg HR of 66 bpm. Predominant underlying rhythm was Sinus Rhythm. Slight P wave morphology changes were noted. 514 Supraventricular Tachycardia runs occurred, the run with the fastest interval lasting 5 beats with a max rate of 203 bpm, the longest lasting 16.6 secs with an avg rate of 107 bpm. Isolated SVEs were occasional (2.7%, 34531), SVE Couplets were rare (<1.0%, 2103), and SVE Triplets were rare (<1.0%, 864). Isolated VEs were rare (<1.0%, 90), VE Couplets were rare (<1.0%, 6), and VE Triplets were rare (<1.0%, 1).  Sinus bradycardia, NSR, sinus tachycardia, PACs, brief PAT, PVCs, rare couplet and triplet Olga Millers               Physical Exam:   VS:  BP 138/76   Pulse 68   Ht 5\' 4"  (1.626 m)   Wt 122 lb 9.6 oz (55.6 kg)   SpO2 96%   BMI 21.04 kg/m    Wt Readings from Last 3 Encounters:  08/03/23 122 lb 9.6 oz (55.6 kg)  12/08/22 126 lb 12.8 oz (57.5 kg)  08/18/22 123  lb 6.4 oz (56 kg)    GEN: Well nourished, well developed in no acute distress NECK: No JVD; No carotid bruits CARDIAC: RRR, no murmurs, rubs, gallops RESPIRATORY:  Clear to auscultation without rales, wheezing or rhonchi  ABDOMEN: Soft, non-tender, non-distended EXTREMITIES:  No edema; No deformity   ASSESSMENT AND PLAN: .   Palpitations: In 08/2022 she wore a cardiac monitor for 13 days that showed a minimum heart rate of 41 bpm, max 203 bpm and average of 66 bpm.  Predominant underlying rhythm was sinus.  She had 514 SVT runs, run with the fastest interval lasting 5 beats with a max rate of 203 bpm, longest lasting 16.6 seconds with an average rate of 107 bpm.Today she reports improved palpitations.  Notes that she occasionally will feel them but are not bothersome.  Recommended good hydration.  Continue all 12.5 mg daily.  Hypertension: Blood pressure today 126/86, on recheck was 138/76. She has been recording her blood pressures at home, averaging mid 150's/70's, ranging from 115/60 to 166/75. Start amlodipine 2.5mg  daily.  She will monitor her blood pressures at home and notify the office if consistently elevated above 140/80.  Hypothyroidism: In 05/2023 tsh 0.11. Monitored and managed by her PCP. Continue synthroid.   CKD stage IIIa: Per Dr. Timothy Lasso notes creatinine was stable in 06/2022.  Monitored by PCP.     Dispo: Follow up with Dr. Jens Som or Reather Littler, NP in 3 months or sooner if needed.   Signed, Rip Harbour, NP

## 2023-08-03 ENCOUNTER — Ambulatory Visit: Payer: Medicare Other | Attending: Nurse Practitioner | Admitting: Cardiology

## 2023-08-03 ENCOUNTER — Encounter: Payer: Self-pay | Admitting: Nurse Practitioner

## 2023-08-03 VITALS — BP 138/76 | HR 68 | Ht 64.0 in | Wt 122.6 lb

## 2023-08-03 DIAGNOSIS — I10 Essential (primary) hypertension: Secondary | ICD-10-CM | POA: Diagnosis not present

## 2023-08-03 DIAGNOSIS — R002 Palpitations: Secondary | ICD-10-CM

## 2023-08-03 DIAGNOSIS — E039 Hypothyroidism, unspecified: Secondary | ICD-10-CM | POA: Diagnosis not present

## 2023-08-03 DIAGNOSIS — N1831 Chronic kidney disease, stage 3a: Secondary | ICD-10-CM

## 2023-08-03 MED ORDER — AMLODIPINE BESYLATE 2.5 MG PO TABS: 2.5000 mg | ORAL_TABLET | Freq: Every day | ORAL | 3 refills | Status: DC

## 2023-08-03 NOTE — Patient Instructions (Signed)
Medication Instructions:  Start Amlodipine 2.5 mg daily  *If you need a refill on your cardiac medications before your next appointment, please call your pharmacy*   Lab Work: NONE ordered at this time of appointment     Testing/Procedures: None    Follow-Up: At [facility], you and your health needs are our priority.  As part of our continuing mission to provide you with exceptional heart care, we have created designated Provider Care Teams.  These Care Teams include your primary Cardiologist (physician) and Advanced Practice Providers (APPs -  Physician Assistants and Nurse Practitioners) who all work together to provide you with the care you need, when you need it.  We recommend signing up for the patient portal called "MyChart".  Sign up information is provided on this After Visit Summary.  MyChart is used to connect with patients for Virtual Visits (Telemedicine).  Patients are able to view lab/test results, encounter notes, upcoming appointments, etc.  Non-urgent messages can be sent to your provider as well.   To learn more about what you can do with MyChart, go to [email or web address].    Your next appointment:   3 month(s)  Provider:   Olga Millers, MD , Rudene Re NP or  Bernadene Person, NP

## 2023-10-04 ENCOUNTER — Other Ambulatory Visit: Payer: Self-pay | Admitting: Cardiology

## 2023-11-01 NOTE — Progress Notes (Signed)
Cardiology Office Note    Date:  11/03/2023  ID:  Paula Liu, DOB November 17, 1932, MRN [id number] PCP:  Creola Corn, MD  Cardiologist:  Olga Millers, MD  Electrophysiologist:  None   Chief Complaint: Follow up for palpitations   History of Present Illness: .    Paula Liu is a 89 y.o. female with visit-pertinent history of palpitations, hypothyroidism, CKD stage IIIa, Raynauds and osteoarthritis.    First evaluated by Dr. Jens Som on 08/18/2022 for evaluation of palpitations.  She described sudden onset of her heart racing into her throat, lasting several minutes and resolving spontaneously.  Echocardiogram on 08/26/2022 indicated EF 60 to 65% LV with normal function and no RWMA diastolic parameters were normal.  She had trivial aortic valve regurgitation.  She wore a cardiac monitor for 13 days that showed a minimum heart rate of 41 bpm, max 203 bpm and average of 66 bpm.  Predominant underlying rhythm was sinus.  She had 514 SVT runs, run with the fastest interval lasting 5 beats with a max rate of 203 bpm, longest lasting 16.6 seconds with an average rate of 107 bpm.  She was started on Toprol 12.5 mg daily.   She was last seen by Dr. Jens Som on 12/08/2022.  She reported that her palpitations were much better controlled.     On 07/25/2023 she notified the office of elevated pulse, noted that she had GI problems the day before and may not have been as well-hydrated as needed. The following day she reported elevated blood pressures, 160-170/70's.  Reported that she overall felt unwell but was unable to provide specifics.   She was last seen in clinic on 08/03/23. She reported that she was doing well overall.  She has been monitoring her blood pressure twice daily, averaging 150/70, ranging from 115/60 to 166/75. She reported that her palpitations had remained improved, noted them on occasion but were not bothersome.   Today she presents for follow up. She reports that she is doing well. She  was working in the yard yesterday, tolerated well. She notes that her home blood pressure cuff is not working well, she plans to get another.  She continues to report improvement in her palpitations, notes she will have palpitations on occasion but overall are not bothersome.  She denies chest pain, shortness of breath, lower extremity edema, presyncope, syncope orthopnea or PND.  ROS: .   Today she denies chest pain, shortness of breath, lower extremity edema, fatigue, melena, hematuria, hemoptysis, diaphoresis, weakness, presyncope, syncope, orthopnea, and PND.  All other systems are reviewed and otherwise negative. Studies Reviewed: Marland Kitchen    EKG:  EKG is not ordered today.   CV Studies:  Cardiac Studies & Procedures       ECHOCARDIOGRAM  ECHOCARDIOGRAM COMPLETE 08/26/2022  Narrative ECHOCARDIOGRAM REPORT    Patient Name:   Paula Liu Date of Exam: 08/26/2022 Medical Rec #:  [id number]       Height:       63.0 in Accession #:    [id number]      Weight:       123.4 lb Date of Birth:  Jan 07, 1932       BSA:          1.575 m Patient Age:    89 years        BP:           138/82 mmHg Patient Gender: F  HR:           74 bpm. Exam Location:  [address]  Procedure: 2D Echo, Cardiac Doppler and Color Doppler  Indications:    R00.2 Palpitations  History:        Patient has no prior history of Echocardiogram examinations. Signs/Symptoms:Shortness of Breath. Palpitations.  Sonographer:    Farrel Conners RDCS Referring Phys: Madolyn Frieze CRENSHAW  IMPRESSIONS   1. Left ventricular ejection fraction, by estimation, is 60 to 65%. The left ventricle has normal function. The left ventricle has no regional wall motion abnormalities. Left ventricular diastolic parameters were normal. 2. Right ventricular systolic function is normal. The right ventricular size is mildly enlarged. There is normal pulmonary artery systolic pressure. 3. Right atrial size was mildly dilated. 4. The  mitral valve is normal in structure. Trivial mitral valve regurgitation. 5. The aortic valve is tricuspid. Aortic valve regurgitation is trivial.  FINDINGS Left Ventricle: Left ventricular ejection fraction, by estimation, is 60 to 65%. The left ventricle has normal function. The left ventricle has no regional wall motion abnormalities. The left ventricular internal cavity size was normal in size. There is no left ventricular hypertrophy. Left ventricular diastolic parameters were normal.  Right Ventricle: The right ventricular size is mildly enlarged. Right vetricular wall thickness was not assessed. Right ventricular systolic function is normal. There is normal pulmonary artery systolic pressure. The tricuspid regurgitant velocity is 2.69 m/s, and with an assumed right atrial pressure of 3 mmHg, the estimated right ventricular systolic pressure is 31.9 mmHg.  Left Atrium: Left atrial size was normal in size.  Right Atrium: Right atrial size was mildly dilated.  Pericardium: There is no evidence of pericardial effusion.  Mitral Valve: The mitral valve is normal in structure. Trivial mitral valve regurgitation.  Tricuspid Valve: The tricuspid valve is normal in structure. Tricuspid valve regurgitation is mild.  Aortic Valve: The aortic valve is tricuspid. Aortic valve regurgitation is trivial.  Pulmonic Valve: The pulmonic valve was normal in structure. Pulmonic valve regurgitation is trivial.  Aorta: The aortic root and ascending aorta are structurally normal, with no evidence of dilitation.  IAS/Shunts: No atrial level shunt detected by color flow Doppler.   LEFT VENTRICLE PLAX 2D LVIDd:         4.20 cm   Diastology LVIDs:         2.50 cm   LV e' medial:    8.59 cm/s LV PW:         0.70 cm   LV E/e' medial:  7.8 LV IVS:        0.80 cm   LV e' lateral:   10.60 cm/s LVOT diam:     2.10 cm   LV E/e' lateral: 6.3 LV SV:         74 LV SV Index:   47 LVOT Area:     3.46 cm  3D  Volume EF: 3D EF:        66 % LV EDV:       111 ml LV ESV:       38 ml LV SV:        73 ml  RIGHT VENTRICLE RV Basal diam:  4.20 cm RV Mid diam:    4.00 cm RV S prime:     14.40 cm/s TAPSE (M-mode): 2.5 cm  LEFT ATRIUM           Index        RIGHT ATRIUM  Index LA diam:      3.20 cm 2.03 cm/m   RA Area:     17.70 cm LA Vol (A2C): 47.5 ml 30.16 ml/m  RA Volume:   53.40 ml  33.91 ml/m LA Vol (A4C): 33.9 ml 21.53 ml/m AORTIC VALVE LVOT Vmax:   110.00 cm/s LVOT Vmean:  61.600 cm/s LVOT VTI:    0.214 m  AORTA Ao Root diam: 3.50 cm Ao Asc diam:  3.50 cm  MITRAL VALVE               TRICUSPID VALVE MV Area (PHT): cm         TR Peak grad:   28.9 mmHg MV Decel Time: 268 msec    TR Vmax:        269.00 cm/s MV E velocity: 67.10 cm/s MV A velocity: 67.90 cm/s  SHUNTS MV E/A ratio:  0.99        Systemic VTI:  0.21 m Systemic Diam: 2.10 cm  Dietrich Pates MD Electronically signed by Dietrich Pates MD Signature Date/Time: 08/26/2022/10:42:23 PM    Final    MONITORS  LONG TERM MONITOR (3-14 DAYS) 09/15/2022  Narrative Patch Wear Time:  13 days and 20 hours (2023-09-01T15:50:44-0400 to 2023-09-15T12:19:16-0400)  Patient had a min HR of 41 bpm, max HR of 203 bpm, and avg HR of 66 bpm. Predominant underlying rhythm was Sinus Rhythm. Slight P wave morphology changes were noted. 514 Supraventricular Tachycardia runs occurred, the run with the fastest interval lasting 5 beats with a max rate of 203 bpm, the longest lasting 16.6 secs with an avg rate of 107 bpm. Isolated SVEs were occasional (2.7%, 34531), SVE Couplets were rare (<1.0%, 2103), and SVE Triplets were rare (<1.0%, 864). Isolated VEs were rare (<1.0%, 90), VE Couplets were rare (<1.0%, 6), and VE Triplets were rare (<1.0%, 1).  Sinus bradycardia, NSR, sinus tachycardia, PACs, brief PAT, PVCs, rare couplet and triplet Olga Millers             Current Reported Medications:.    Current Meds  Medication Sig    amLODipine (NORVASC) 2.5 MG tablet Take 1 tablet (2.5 mg total) by mouth daily.   Cholecalciferol (VITAMIN D-3 PO) Take by mouth daily.   levothyroxine (SYNTHROID) 75 MCG tablet Take 75 mcg by mouth daily.   metoprolol succinate (TOPROL-XL) 25 MG 24 hr tablet TAKE HALF TABLET BY MOUTH AT BEDTIME.   Multiple Vitamins-Minerals (VITAMIN D3 COMPLETE PO) Take by mouth.   Zinc 50 MG TABS Take 50 mg by mouth daily.    Physical Exam:    VS:  BP 126/70   Pulse 61   Ht 5\' 4"  (1.626 m)   Wt 122 lb 6.4 oz (55.5 kg)   SpO2 99%   BMI 21.01 kg/m    Wt Readings from Last 3 Encounters:  11/03/23 122 lb 6.4 oz (55.5 kg)  08/03/23 122 lb 9.6 oz (55.6 kg)  12/08/22 126 lb 12.8 oz (57.5 kg)    GEN: Well nourished, well developed in no acute distress NECK: No JVD; No carotid bruits CARDIAC: RRR, no murmurs, rubs, gallops RESPIRATORY:  Clear to auscultation without rales, wheezing or rhonchi  ABDOMEN: Soft, non-tender, non-distended EXTREMITIES:  No edema; No acute deformity   Asessement and Plan:.   Palpitations: In 08/2022 she wore a cardiac monitor for 13 days that showed a minimum heart rate of 41 bpm, max 203 bpm and average of 66 bpm.  Predominant underlying rhythm was sinus.  She had 514  SVT runs, run with the fastest interval lasting 5 beats with a max rate of 203 bpm, longest lasting 16.6 seconds with an average rate of 107 bpm. Today she reports improved palpitations, she reports she will occasionally will feel them but are not bothersome.  Recommended staying well hydrated. Continue Toprol 12.5 mg daily.  Hypertension: Blood pressure well-controlled today at 126/70.  She notes that her home blood pressure cuff has not been working well, she plans to get another. Continue amlodipine 2.5mg  daily.  She will monitor her blood pressures at home and notify the office if consistently elevated above 140/80.  Hypothyroidism: In 05/2023 tsh 0.11. Monitored and managed by her PCP. Continue synthroid.    CKD stage IIIa: Per Dr. Timothy Lasso notes creatinine was stable in 06/2022. Monitored by PCP.   Disposition: F/u with Dr. Jens Som or Reather Littler, NP in 6 months or sooner if needed.   Signed, Rip Harbour, NP

## 2023-11-03 ENCOUNTER — Ambulatory Visit: Payer: Medicare Other | Attending: Cardiology | Admitting: Cardiology

## 2023-11-03 ENCOUNTER — Encounter: Payer: Self-pay | Admitting: Cardiology

## 2023-11-03 VITALS — BP 126/70 | HR 61 | Ht 64.0 in | Wt 122.4 lb

## 2023-11-03 DIAGNOSIS — I10 Essential (primary) hypertension: Secondary | ICD-10-CM

## 2023-11-03 DIAGNOSIS — R002 Palpitations: Secondary | ICD-10-CM | POA: Diagnosis not present

## 2023-11-03 DIAGNOSIS — E039 Hypothyroidism, unspecified: Secondary | ICD-10-CM | POA: Diagnosis not present

## 2023-11-03 DIAGNOSIS — N1831 Chronic kidney disease, stage 3a: Secondary | ICD-10-CM

## 2023-11-03 NOTE — Patient Instructions (Signed)
 Medication Instructions:  No changes *If you need a refill on your cardiac medications before your next appointment, please call your pharmacy*   Lab Work: No labs   Testing/Procedures: No testing   Follow-Up: At [facility], you and your health needs are our priority.  As part of our continuing mission to provide you with exceptional heart care, we have created designated Provider Care Teams.  These Care Teams include your primary Cardiologist (physician) and Advanced Practice Providers (APPs -  Physician Assistants and Nurse Practitioners) who all work together to provide you with the care you need, when you need it.  We recommend signing up for the patient portal called "MyChart".  Sign up information is provided on this After Visit Summary.  MyChart is used to connect with patients for Virtual Visits (Telemedicine).  Patients are able to view lab/test results, encounter notes, upcoming appointments, etc.  Non-urgent messages can be sent to your provider as well.   To learn more about what you can do with MyChart, go to [email or web address].    Your next appointment:   6 month(s)  Provider:   Olga Millers, MD

## 2024-01-31 ENCOUNTER — Other Ambulatory Visit: Payer: Self-pay | Admitting: Cardiology

## 2024-06-25 NOTE — Progress Notes (Signed)
     HPI: FU palpitations; echocardiogram September 2023 showed normal LV function, mild right ventricular enlargement, mild right atrial enlargement; trace aortic insufficiency.  Monitor September 2023 showed sinus rhythm with occasional PACs, brief PAT, PVCs, rare couplet and triplet.  Metoprolol  added.  Since last seen she denies dyspnea, chest pain, palpitations or syncope.  Current Outpatient Medications  Medication Sig Dispense Refill   amLODipine  (NORVASC ) 2.5 MG tablet Take 1 tablet (2.5 mg total) by mouth daily. 90 tablet 3   Cholecalciferol (VITAMIN D-3 PO) Take by mouth daily.     levothyroxine (SYNTHROID) 75 MCG tablet Take 75 mcg by mouth daily.     metoprolol  succinate (TOPROL -XL) 25 MG 24 hr tablet TAKE 1/2 TABLET BY MOUTH AT BEDTIME 45 tablet 2   Multiple Vitamins-Minerals (VITAMIN D3 COMPLETE PO) Take by mouth.     Zinc 50 MG TABS Take 50 mg by mouth daily.     No current facility-administered medications for this visit.     Past Medical History:  Diagnosis Date   Hypothyroid    Nephrolithiasis     Past Surgical History:  Procedure Laterality Date   APPENDECTOMY     BREAST EXCISIONAL BIOPSY Left 1972   benign   HYSTERECTOMY ABDOMINAL WITH SALPINGO-OOPHORECTOMY      Social History   Socioeconomic History   Marital status: Divorced    Spouse name: Not on file   Number of children: 4   Years of education: Not on file   Highest education level: Not on file  Occupational History   Not on file  Tobacco Use   Smoking status: Never   Smokeless tobacco: Never  Substance and Sexual Activity   Alcohol use: Never   Drug use: Not on file   Sexual activity: Not on file  Other Topics Concern   Not on file  Social History Narrative   Not on file   Social Drivers of Health   Financial Resource Strain: Not on file  Food Insecurity: Not on file  Transportation Needs: Not on file  Physical Activity: Not on file  Stress: Not on file  Social Connections: Not on  file  Intimate Partner Violence: Not on file    Family History  Problem Relation Age of Onset   Cancer Mother    Breast cancer Neg Hx     ROS: Difficulties with memory by her report but no fevers or chills, productive cough, hemoptysis, dysphasia, odynophagia, melena, hematochezia, dysuria, hematuria, rash, seizure activity, orthopnea, PND, pedal edema, claudication. Remaining systems are negative.  Physical Exam: Well-developed well-nourished in no acute distress.  Skin is warm and dry.  HEENT is normal.  Neck is supple.  Chest is clear to auscultation with normal expansion.  Cardiovascular exam is regular rate and rhythm.  Abdominal exam nontender or distended. No masses palpated. Extremities show no edema. neuro grossly intact  EKG Interpretation Date/Time:  Wednesday July 03 2024 08:55:52 EDT Ventricular Rate:  58 PR Interval:  128 QRS Duration:  78 QT Interval:  394 QTC Calculation: 386 R Axis:   4  Text Interpretation: Sinus bradycardia  pacs Confirmed by Pietro Rogue ([id number]) on 07/03/2024 9:01:04 AM    A/P  1 palpitations-symptoms are controlled with present dose of metoprolol .  Will continue.  2 hypertension-patient's blood pressure is controlled.  Continue present medical regimen.  Rogue Pietro, MD

## 2024-07-03 ENCOUNTER — Telehealth: Payer: Self-pay | Admitting: Cardiology

## 2024-07-03 ENCOUNTER — Ambulatory Visit: Attending: Cardiovascular Disease | Admitting: Cardiology

## 2024-07-03 ENCOUNTER — Encounter: Payer: Self-pay | Admitting: Cardiology

## 2024-07-03 VITALS — BP 140/78 | HR 58 | Ht 64.0 in | Wt 119.0 lb

## 2024-07-03 DIAGNOSIS — R002 Palpitations: Secondary | ICD-10-CM

## 2024-07-03 DIAGNOSIS — I10 Essential (primary) hypertension: Secondary | ICD-10-CM | POA: Diagnosis not present

## 2024-07-03 NOTE — Telephone Encounter (Signed)
 Pt c/o medication issue:  1. Name of Medication:   metoprolol  succinate (TOPROL -XL) 25 MG 24 hr tablet    2. How are you currently taking this medication (dosage and times per day)?   3. Are you having a reaction (difficulty breathing--STAT)?   4. What is your medication issue?   Patient says she has been taking a whole tablet instead of 1/2 because she was never advised to only take 1/2. She would like to know what the medication is for and if it would be alright to continue on a whole tablet. Please advise.

## 2024-07-03 NOTE — Patient Instructions (Signed)
 Medication Instructions:  Your physician recommends that you continue on your current medications as directed. Please refer to the Current Medication list given to you today.  *If you need a refill on your cardiac medications before your next appointment, please call your pharmacy*   Follow-Up: At [facility], you and your health needs are our priority.  As part of our continuing mission to provide you with exceptional heart care, our providers are all part of one team.  This team includes your primary Cardiologist (physician) and Advanced Practice Providers or APPs (Physician Assistants and Nurse Practitioners) who all work together to provide you with the care you need, when you need it.  Your next appointment:   1 year(s)  Provider:   Redell Shallow, MD

## 2024-07-03 NOTE — Telephone Encounter (Signed)
 Call to patient to discuss concerns about toprol  dose. Patient reports she was not aware she was only supposed to take half a tablet of Toprol , she states she has been taking a full tablet for as long as she has been on this medication, which our records indicate she may have been since 2023.   Patient was unable to tell me when she last filled metoprolol  and had difficulty reading the tiny print on the bottle. Call to pharmacy and verified patient has been picking up her toprol  too early and was told by the pharmacy several times that she was refilling too soon. Pharmacy tech states they sent over a clarification request today in regards to patient's correct dose. Verified that her prescription has been filled with 25 mg tables in a quantity of 45 as they do not pre-split meds for patients at this pharmacy. Forwarded to Dr. Pietro for further advice.

## 2024-07-04 MED ORDER — METOPROLOL SUCCINATE ER 25 MG PO TB24: 25.0000 mg | ORAL_TABLET | Freq: Every day | ORAL | 3 refills | Status: DC

## 2024-07-04 NOTE — Addendum Note (Signed)
 Addended by: Brooklen Runquist W on: 07/04/2024 02:02 PM   Modules accepted: Orders

## 2024-07-04 NOTE — Telephone Encounter (Signed)
 Pt would like a c/b she still doesn't understand the medication instructions. Please advise

## 2024-07-04 NOTE — Telephone Encounter (Signed)
Spoke with pt, Aware of dr crenshaw's recommendations. New script sent to the pharmacy  

## 2024-07-04 NOTE — Telephone Encounter (Signed)
 Called patient about message patient did not realize that she was suppose to be taking metoprolol  12.5 mg daily. Patient stated she has been taking metoprolol  25 mg daily. Will send message to Dr. Pietro to see if it is okay for patient to continue to take metoprolol  25 mg daily, and if he is agreeable to this to send her a new prescription.

## 2024-07-10 ENCOUNTER — Ambulatory Visit
Admission: RE | Admit: 2024-07-10 | Discharge: 2024-07-10 | Disposition: A | Discharge status: Home / Self Care | Source: Ambulatory Visit | Attending: Family Medicine | Admitting: Family Medicine

## 2024-07-10 VITALS — BP 155/84 | HR 70 | Temp 98.5°F | Resp 18

## 2024-07-10 DIAGNOSIS — R238 Other skin changes: Secondary | ICD-10-CM

## 2024-07-10 MED ORDER — KETOCONAZOLE 2 % EX SHAM: 1.0000 | MEDICATED_SHAMPOO | CUTANEOUS | 0 refills | Status: AC

## 2024-07-10 MED ORDER — CLOBETASOL PROPIONATE 0.05 % EX FOAM: CUTANEOUS | 0 refills | Status: AC

## 2024-07-10 NOTE — ED Triage Notes (Signed)
 Pt states she is having pain and redness to her scalp, pt states it will scale up sometimes x 1 year on and off.

## 2024-07-11 NOTE — ED Provider Notes (Signed)
  [facility]   [id number] 07/10/24 Arrival Time: 1440  ASSESSMENT & PLAN:  1. Scalp irritation   No signs of bacterial infection. Unclear etiology. Trial of: Meds ordered this encounter  Medications   ketoconazole  (NIZORAL ) 2 % shampoo    Sig: Apply 1 Application topically 2 (two) times a week.    Dispense:  120 mL    Refill:  0   clobetasol  (OLUX ) 0.05 % topical foam    Sig: Apply twice daily for two weeks.    Dispense:  100 g    Refill:  0   Has derm f/u next month.  Will follow up with PCP or here if worsening or failing to improve as anticipated. Reviewed expectations re: course of current medical issues. Questions answered. Outlined signs and symptoms indicating need for more acute intervention. Patient verbalized understanding. After Visit Summary given.   SUBJECTIVE:  Paula Liu is a 89 y.o. female who presents with a skin complaint. Pt states she is having pain and redness to her scalp, pt states it will scale up sometimes x 1 year on and off.  Has derm f/u next month.    OBJECTIVE: Vitals:   07/10/24 1519  BP: (!) 155/84  Pulse: 70  Resp: 18  Temp: 98.5 F (36.9 C)  TempSrc: Oral  SpO2: 95%    General appearance: alert; no distress HEENT: [place]; AT Neck: supple with FROM Extremities: no edema; moves all extremities normally Skin: warm and dry; scalp with mild redness and areas of white scaling Psychological: alert and cooperative; normal mood and affect  Allergies  Allergen Reactions   Ciprofloxacin Other (See Comments)    dizziness   Cefdinir     Other Reaction(s): diarrhea,dizziness    Past Medical History:  Diagnosis Date   Hypothyroid    Nephrolithiasis    Social History   Socioeconomic History   Marital status: Divorced    Spouse name: Not on file   Number of children: 4   Years of education: Not on file   Highest education level: Not on file  Occupational History   Not on file  Tobacco Use   Smoking status: Never    Smokeless tobacco: Never  Vaping Use   Vaping status: Never Used  Substance and Sexual Activity   Alcohol use: Never   Drug use: Never   Sexual activity: Not on file  Other Topics Concern   Not on file  Social History Narrative   Not on file   Social Drivers of Health   Financial Resource Strain: Not on file  Food Insecurity: Not on file  Transportation Needs: Not on file  Physical Activity: Not on file  Stress: Not on file  Social Connections: Not on file  Intimate Partner Violence: Not on file   Family History  Problem Relation Age of Onset   Cancer Mother    Breast cancer Neg Hx    Past Surgical History:  Procedure Laterality Date   APPENDECTOMY     BREAST EXCISIONAL BIOPSY Left 1972   benign   HYSTERECTOMY ABDOMINAL WITH SALPINGO-OOPHORECTOMY        Rolinda Rogue, MD 07/11/24 [phone]

## 2024-07-19 ENCOUNTER — Other Ambulatory Visit: Payer: Self-pay | Admitting: Cardiology

## 2024-11-19 ENCOUNTER — Telehealth: Payer: Self-pay | Admitting: Cardiology

## 2024-11-19 NOTE — Telephone Encounter (Signed)
 Left message for pt to call.

## 2024-11-19 NOTE — Telephone Encounter (Signed)
 Pt c/o medication issue:  1. Name of Medication:   metoprolol  succinate (TOPROL -XL) 25 MG 24 hr tablet    2. How are you currently taking this medication (dosage and times per day)? Pt is not sure  3. Are you having a reaction (difficulty breathing--STAT)? no  4. What is your medication issue? Pt said someone changed her script from taking a half of tablet to a whole tablet. And she said she will not take the medication til someone tells her.  She is also upset because she said she hasn't seen Dr Pietro. I told her she just saw him in July and she said she didn't. She seems really confused.

## 2024-11-20 MED ORDER — METOPROLOL SUCCINATE ER 25 MG PO TB24: 12.5000 mg | ORAL_TABLET | Freq: Every day | ORAL | Status: DC

## 2024-11-20 NOTE — Telephone Encounter (Signed)
 Spoke with pt, she reports she is waking up in the night and is not able to go back to sleep. She feels it is related to metoprolol . She is currently taking 25 mg once daily. She was given the okay to reduce the dose to 1/2 tablet daily. She denies any SOB or chest pain. She just can not go back to sleep. She is to call after reducing the dose if continues to have problems.

## 2024-11-20 NOTE — Telephone Encounter (Signed)
 Left message for pt to call.

## 2024-12-13 ENCOUNTER — Other Ambulatory Visit: Payer: Self-pay | Admitting: Cardiology
